# Patient Record
Sex: Male | Born: 1990 | Race: Black or African American | Hispanic: No | Marital: Married | Smoking: Never smoker
Health system: Southern US, Community
[De-identification: ages and names within clinical notes are randomized; demographics above are authoritative.]

---

## 2014-09-28 ENCOUNTER — Emergency Department (HOSPITAL_COMMUNITY)
Admission: EM | Admit: 2014-09-28 | Discharge: 2014-09-28 | Disposition: A | Discharge status: Home / Self Care | Payer: Self-pay | Attending: Emergency Medicine | Admitting: Emergency Medicine

## 2014-09-28 ENCOUNTER — Emergency Department (HOSPITAL_COMMUNITY): Payer: Self-pay

## 2014-09-28 ENCOUNTER — Encounter (HOSPITAL_COMMUNITY): Payer: Self-pay | Admitting: Emergency Medicine

## 2014-09-28 DIAGNOSIS — W1841XA Slipping, tripping and stumbling without falling due to stepping on object, initial encounter: Secondary | ICD-10-CM | POA: Insufficient documentation

## 2014-09-28 DIAGNOSIS — S93401A Sprain of unspecified ligament of right ankle, initial encounter: Secondary | ICD-10-CM | POA: Insufficient documentation

## 2014-09-28 DIAGNOSIS — Y9289 Other specified places as the place of occurrence of the external cause: Secondary | ICD-10-CM | POA: Insufficient documentation

## 2014-09-28 DIAGNOSIS — Y998 Other external cause status: Secondary | ICD-10-CM | POA: Insufficient documentation

## 2014-09-28 DIAGNOSIS — Y9389 Activity, other specified: Secondary | ICD-10-CM | POA: Insufficient documentation

## 2014-09-28 MED ORDER — TRAMADOL HCL 50 MG PO TABS: 50.0000 mg | ORAL_TABLET | Freq: Four times a day (QID) | ORAL | Status: AC | PRN

## 2014-09-28 MED ORDER — NAPROXEN 500 MG PO TABS: 500.0000 mg | ORAL_TABLET | Freq: Two times a day (BID) | ORAL | Status: AC

## 2014-09-28 NOTE — ED Notes (Signed)
Pt. stepped on ice and twisted his right ankle this afternoon , reports pain and mild swelling at right ankle /ambulatory .

## 2014-09-28 NOTE — ED Provider Notes (Signed)
CSN: [id number]     Arrival date & time 09/28/14  1948 History  This chart was scribed for non-physician practitioner, Santiago Glad, PA-C,  working with Rolan Bucco, MD, by Lionel December, ED Scribe. This patient was seen in room TR08C/TR08C and the patient's care was started at 9:05 PM.  First MD Initiated Contact with Patient 09/28/14 2009     Chief Complaint  Patient presents with  . Ankle Injury     (Consider location/radiation/quality/duration/timing/severity/associated sxs/prior Treatment) Patient is a 24 y.o. male presenting with lower extremity injury. The history is provided by the patient. No language interpreter was used.  Ankle Injury    HPI Comments: Martin Sawyer is a 24 y.o. male who presents to the Emergency Department complaining of right ankle pain that occurred after stepping on ice and twisting his ankle this afternoon (3pm).  Patient notes of swelling and is ambulatory (but in pain during ambulation). He notes that he feels pain when he moves his toes, but not when he moves his knee.  He denies falling.  Denies hip or knee pain.  Denies numbness or tingling.  He has taken OTC pain medication without relief.  Patient has no other past medical conditions.       History reviewed. No pertinent past medical history. History reviewed. No pertinent past surgical history. No family history on file. History  Substance Use Topics  . Smoking status: Never Smoker   . Smokeless tobacco: Not on file  . Alcohol Use: No    Review of Systems  Constitutional: Negative for fever and chills.  Musculoskeletal: Positive for myalgias.      Allergies  Review of patient's allergies indicates no known allergies.  Home Medications   Prior to Admission medications   Not on File   BP 165/79 mmHg  Pulse 105  Temp(Src) 99 F (37.2 C) (Oral)  Resp 16  Ht  (1.778 m)  Wt 205 lb (92.987 kg)  BMI 29.41 kg/m2  SpO2 99% Physical Exam  Constitutional: He is oriented  to person, place, and time. He appears well-developed and well-nourished. No distress.  HENT:  Head: Normocephalic and atraumatic.  Eyes: Conjunctivae and EOM are normal.  Neck: Neck supple. No tracheal deviation present.  Cardiovascular: Normal rate and regular rhythm.   Pulses:      Dorsalis pedis pulses are 2+ on the right side, and 2+ on the left side.  Pulmonary/Chest: Effort normal and breath sounds normal. No respiratory distress.  Musculoskeletal: Normal range of motion.  tenderness to palpation and swelling over right lateral malleolus   Neurological: He is alert and oriented to person, place, and time.  Distal sensation of the right foot intact.  Skin: Skin is warm and dry.  Psychiatric: He has a normal mood and affect. His behavior is normal.  Nursing note and vitals reviewed.   ED Course  Procedures (including critical care time) DIAGNOSTIC STUDIES: Oxygen Saturation is 99% on RA, normal by my interpretation.    COORDINATION OF CARE: 9:07 PM Discussed treatment plan with patient at beside, the patient agrees with the plan and has no further questions at this time.   Labs Review Labs Reviewed - No data to display  Imaging Review Dg Ankle Complete Right  09/28/2014   CLINICAL DATA:  Initial encounter for twisting injury.  Pain.  EXAM: RIGHT ANKLE - COMPLETE 3+ VIEW  COMPARISON:  None.  FINDINGS: No acute fracture or dislocation. Mild lateral soft tissue swelling. Base of fifth metatarsal and talar  dome intact.  IMPRESSION: Lateral soft tissue swelling only.   Electronically Signed   By: Jeronimo GreavesKyle  Talbot M.D.   On: 09/28/2014 20:53     EKG Interpretation None      MDM   Final diagnoses:  None   Patient presents today with ankle pain after twisting his ankle.  Xray is negative.  Neurovascularly intact.  Patient given ASO and crutches.  Stable for discharge.  Return precautions given.   Santiago GladHeather Mylee Falin, PA-C 09/28/14 [phone]  Rolan BuccoMelanie Belfi, MD 09/28/14 2330

## 2014-09-28 NOTE — ED Notes (Signed)
Pt a/ox 4 on d/c with crutches and family at side.

## 2016-06-01 IMAGING — DX DG ANKLE COMPLETE 3+V*R*
3 series · 3 of 3 positions shown · non-contrast
Comparison: None.

CLINICAL DATA: Initial encounter for twisting injury.  Pain.

EXAM:
RIGHT ANKLE - COMPLETE 3+ VIEW

[ankle ap]
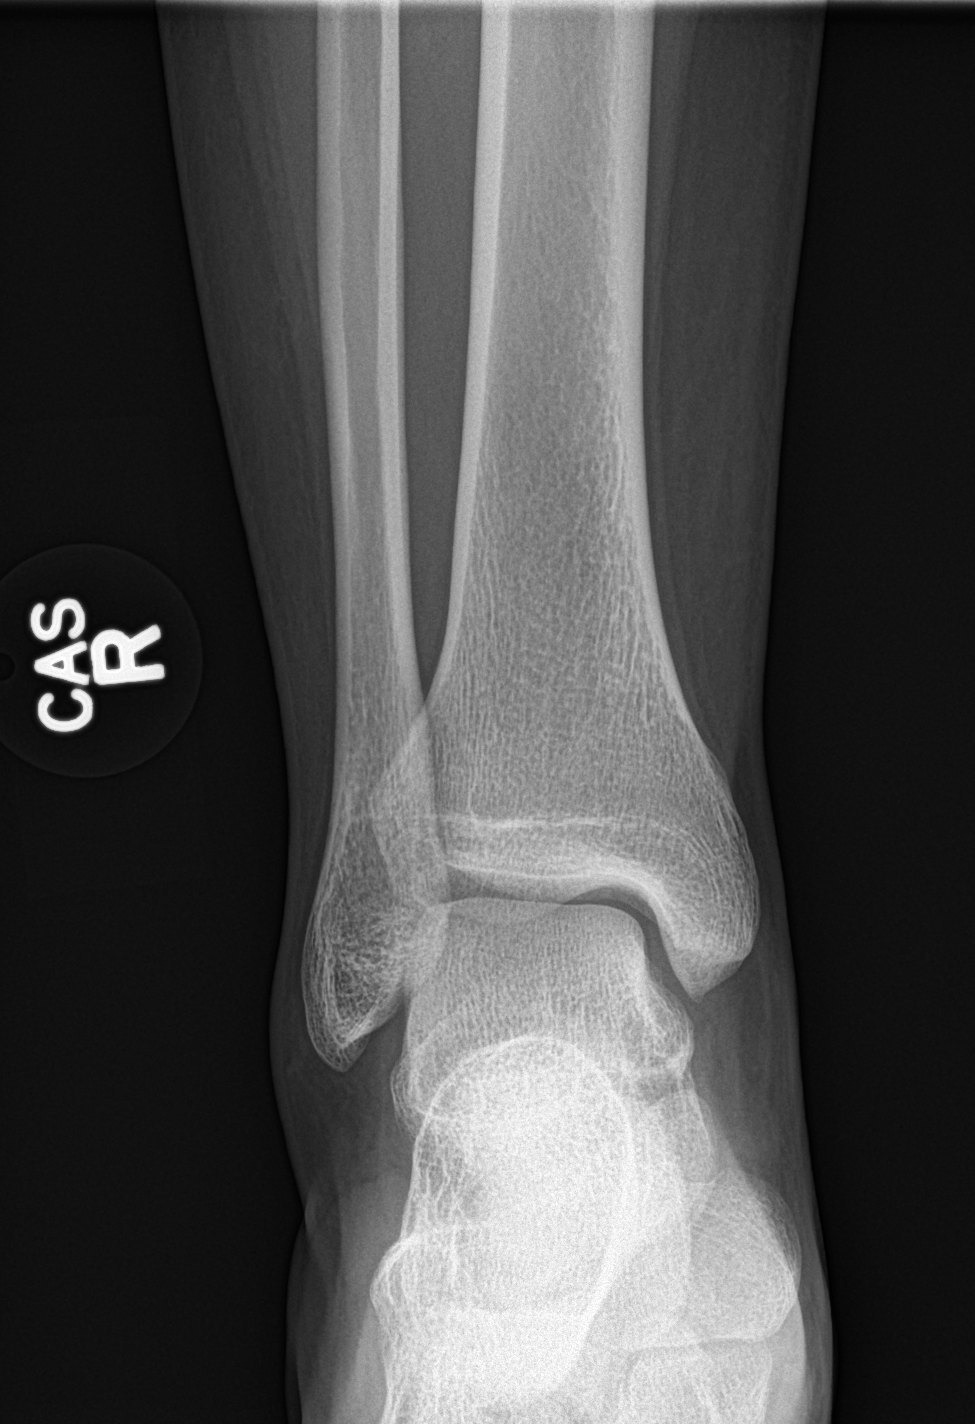

[ankle obl]
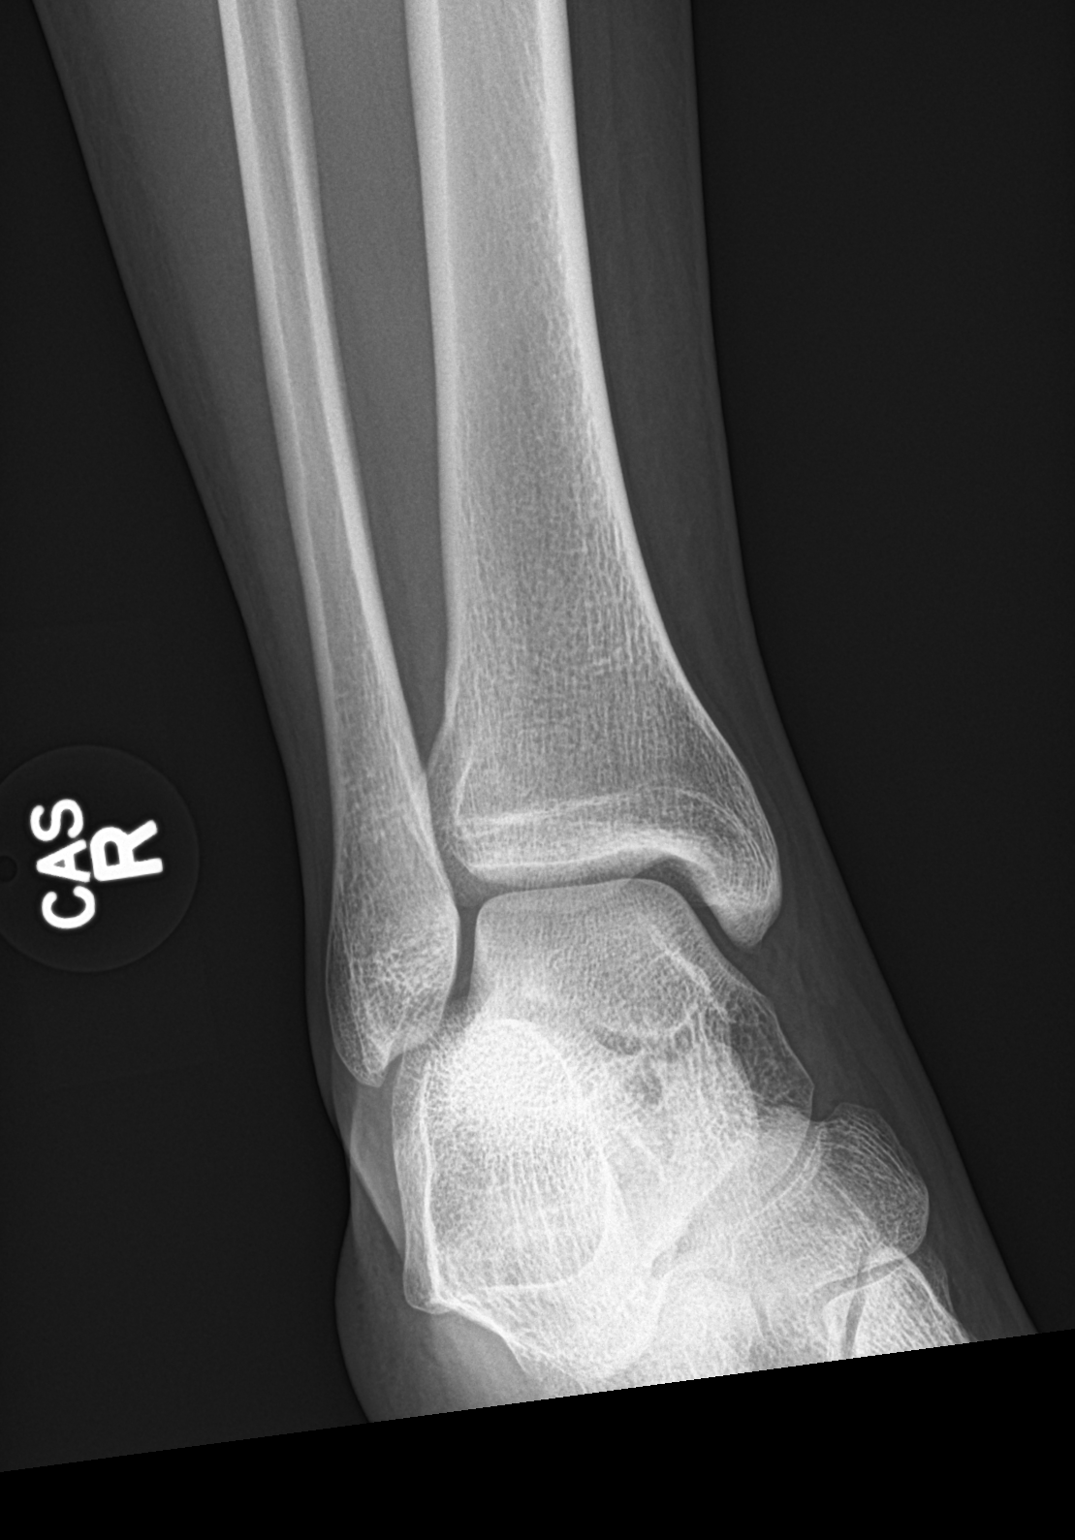

[ankle lat]
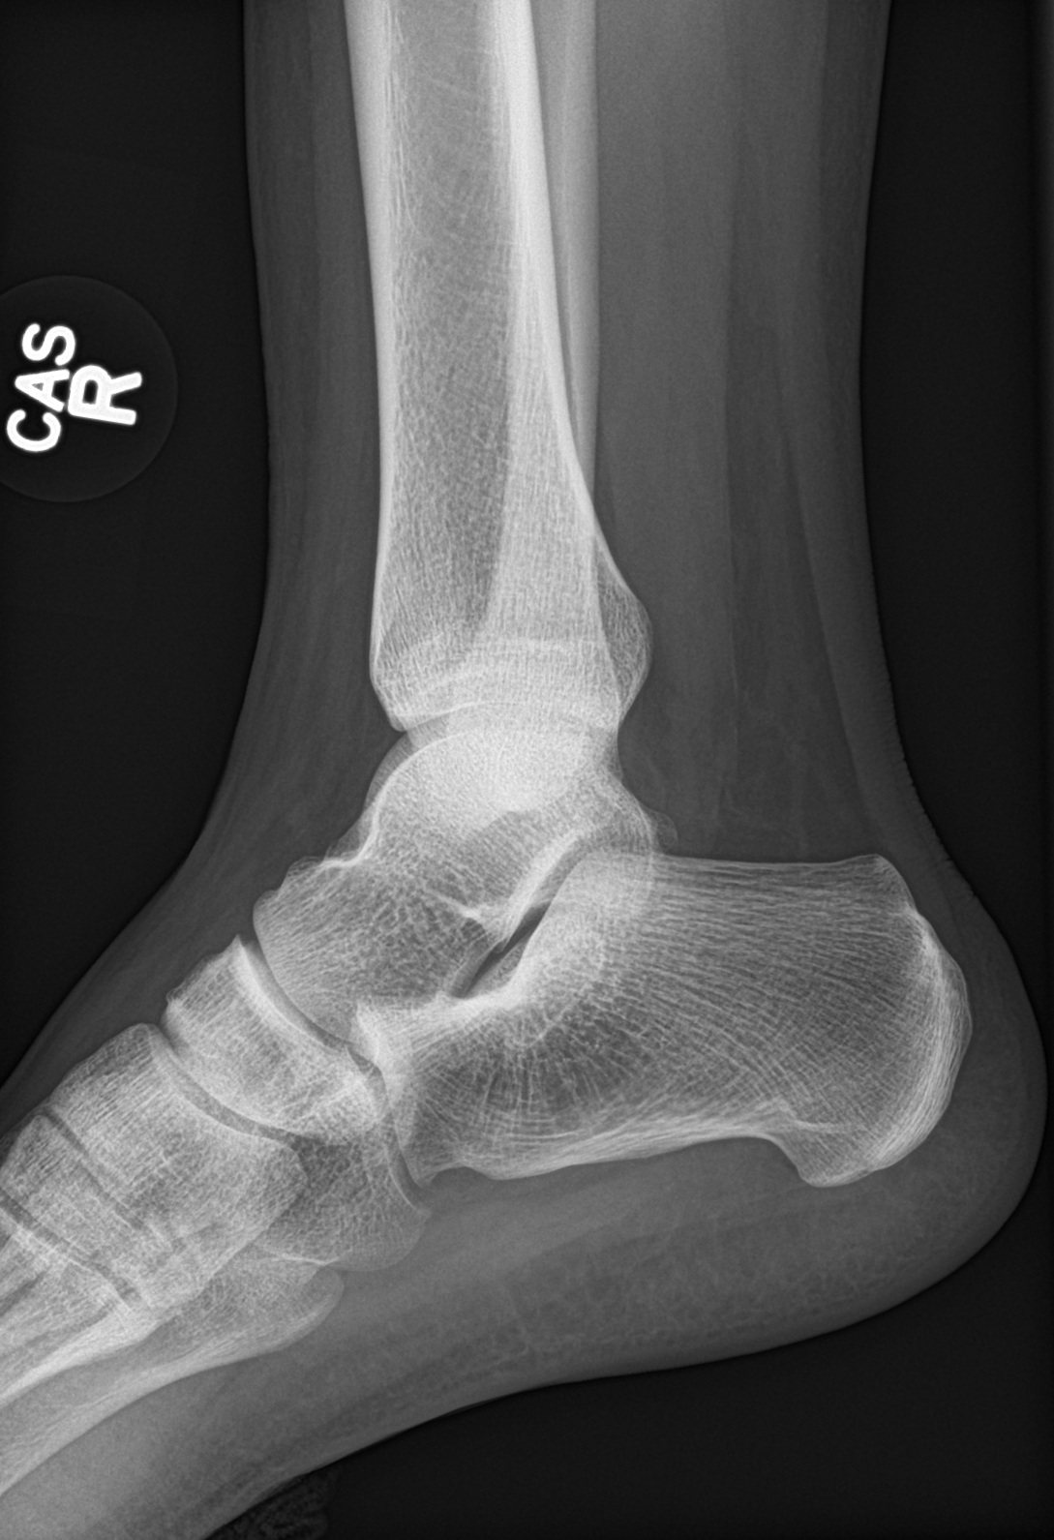

[3 of 3 positions shown; findings below may reference images not displayed]

FINDINGS: No acute fracture or dislocation. Mild lateral soft tissue swelling.
Base of fifth metatarsal and talar dome intact.
IMPRESSION: Lateral soft tissue swelling only.
